# Patient Record
Sex: Male | Born: 1960 | Race: White | Hispanic: No | Marital: Married | Smoking: Current every day smoker
Health system: Southern US, Community
[De-identification: ages and names within clinical notes are randomized; demographics above are authoritative.]

## PROBLEM LIST (undated history)

## (undated) DIAGNOSIS — C801 Malignant (primary) neoplasm, unspecified: Secondary | ICD-10-CM

## (undated) HISTORY — DX: Malignant (primary) neoplasm, unspecified: C80.1

---

## 2012-01-08 LAB — HCV RNA QUANT: HCV RNA (IU/mL): 40700

## 2016-01-14 ENCOUNTER — Ambulatory Visit: Payer: Self-pay | Admitting: Osteopathic Medicine

## 2016-02-03 ENCOUNTER — Ambulatory Visit (INDEPENDENT_AMBULATORY_CARE_PROVIDER_SITE_OTHER): Payer: Self-pay | Admitting: Osteopathic Medicine

## 2016-02-03 ENCOUNTER — Encounter: Payer: Self-pay | Admitting: Osteopathic Medicine

## 2016-02-03 VITALS — BP 138/100 | HR 78 | Ht 71.0 in | Wt 168.0 lb

## 2016-02-03 DIAGNOSIS — K769 Liver disease, unspecified: Secondary | ICD-10-CM

## 2016-02-03 DIAGNOSIS — R1011 Right upper quadrant pain: Secondary | ICD-10-CM | POA: Insufficient documentation

## 2016-02-03 DIAGNOSIS — N400 Enlarged prostate without lower urinary tract symptoms: Secondary | ICD-10-CM

## 2016-02-03 DIAGNOSIS — K7689 Other specified diseases of liver: Secondary | ICD-10-CM

## 2016-02-03 DIAGNOSIS — B182 Chronic viral hepatitis C: Secondary | ICD-10-CM | POA: Insufficient documentation

## 2016-02-03 MED ORDER — OXYCODONE HCL 5 MG PO TABS: 5.0000 mg | ORAL_TABLET | Freq: Two times a day (BID) | ORAL | 0 refills | Status: DC | PRN

## 2016-02-03 MED ORDER — TAMSULOSIN HCL 0.4 MG PO CAPS: 0.4000 mg | ORAL_CAPSULE | Freq: Every day | ORAL | 6 refills | Status: DC

## 2016-02-03 NOTE — Patient Instructions (Signed)
1. Need MRI to get a better look at this spot on the liver. This spot may or may not be cancer. You may require further testing to diagnose whether or not this is cancer. 2. Need to get set up with gastroenterologist for help with management of hepatitis C and further evaluation for portal hypertension which may be contributing to your liver cirrhosis and your abdominal pain.  If you do not hear back about the MRI or the referral to GI within the next few days, please let us know. For blood test results, I typically will have these back the following day.

## 2016-02-03 NOTE — Progress Notes (Signed)
HPI: Joseph Blake is a 55 y.o. male  who presents to [place] today, 02/03/16,  for chief complaint of:  Chief Complaint  Patient presents with  . Establish Care     . Context: ER visit not long ago for abdominal pain, CT demonstrated 15 mm mass and patient was under the impression that this was cancer. Brings a printout from the ER discharge instructions, does mention possibility of cancer. Recommended follow-up with PCP for MRI. This is what brings the patient to the office here today. . Location: Right upper quadrant . Quality: Sore, occasionally sharp pain . Duration: Greater over the past month . Timing: Intermittent . Modifying factors: Oxycodone from emergency room was helpful, patient has been using this sparingly, . Assoc signs/symptoms: History of hepatitis C  Patient is accompanied by wife who assists with history-taking.   Past medical, surgical, social and family history reviewed: Past Medical History:  Diagnosis Date  . Cancer [facility])    LIVER   No past surgical history on file. Social History  Substance Use Topics  . Smoking status: Current Every Day Smoker    Types: Cigarettes  . Smokeless tobacco: Never Used  . Alcohol use No   Family History  Problem Relation Age of Onset  . Alcohol abuse Father   . Cancer Father     COLON AND LIVER  . Hyperlipidemia Maternal Uncle   . Alcohol abuse Paternal Grandfather      Current medication list and allergy/intolerance information reviewed:   Current Outpatient Prescriptions  Medication Sig Dispense Refill  . oxyCODONE (OXY IR/ROXICODONE) 5 MG immediate release tablet TK 1 T PO Q 6 H PRN FOR UP TO 8 DOSES  0  . tamsulosin (FLOMAX) 0.4 MG CAPS capsule Take 0.4 mg by mouth.     No current facility-administered medications for this visit.    No Known Allergies    Review of Systems:  Constitutional:  No  fever, no chills, No recent illness, No unintentional weight changes. No  significant fatigue.   HEENT: + occsional headache, no vision change, no hearing change, No sore throat, No  sinus pressure  Cardiac: No  chest pain, No  pressure, No palpitations, No  Orthopnea  Respiratory:  No  shortness of breath. No  Cough  Gastrointestinal: +abdominal pain, +nausea, No  vomiting,  No  blood in stool, No  diarrhea, No  constipation   Musculoskeletal: No new myalgia/arthralgia  Skin: No  Rash, No other wounds/concerning lesions  Hem/Onc: No  easy bruising/bleeding  Neurologic: No  weakness, No  dizziness,  Psychiatric: No  concerns with depression, No  concerns with anxiety,  Exam:  BP (!) 138/100   Pulse 78   Ht 5\' 11"  (1.803 m)   Wt 168 lb (76.2 kg)   BMI 23.43 kg/m   Constitutional: VS see above. General Appearance: alert, well-developed, well-nourished, NAD  Eyes: Normal lids and conjunctive, non-icteric sclera  Ears, Nose, Mouth, Throat: MMM, Normal external inspection ears/nares/mouth/lips/gums.   Neck: No masses, trachea midline. No thyroid enlargement. No tenderness/mass appreciated. No lymphadenopathy  Respiratory: Normal respiratory effort. no wheeze, no rhonchi, no rales  Cardiovascular: S1/S2 normal, no murmur, no rub/gallop auscultated. RRR. No lower extremity edema. .  Gastrointestinal: (+) tenderness RUQ, no masses. Mild hepatomegaly, no splenomegaly. No hernia appreciated. NO caput medusae, no fluid wave. Bowel sounds normal. Rectal exam deferred.   Musculoskeletal: Gait normal. No clubbing/cyanosis of digits.   Neurological:Normal balance/coordination. No tremor.   Skin:  warm, dry, intact. No rash/ulcer.   Psychiatric: Normal judgment/insight. Normal mood and affect. Oriented x3.    Reviewed abdominal CT results, seek care everywhere. 15 mm hepatic lesion concerning for neoplastic disease, possible portal hypertension, cirrhosis. AST and ALT elevated but not extremely so compared to previous measures.  ASSESSMENT/PLAN:    Advised patient that based on CT results alone I would not diagnose cancer, I believe there was probably a miscommunication with the emergency room. Of course, we do need to rule out neoplastic disease so we'll get MRI as noted below for further evaluation. Patient advised that based on results may need to consider further workup with biopsy/other.  Given hepatitis C, labs as below and patient needs to reestablish with gastroenterologist. Lack of Insurance has been an issue for him.  Requests Flomax, this was provided refill  Hepatic lesion - Plan: MR Liver W Wo Contrast, oxyCODONE (OXY IR/ROXICODONE) 5 MG immediate release tablet, Ambulatory referral to Gastroenterology  BPH (benign prostatic hyperplasia) - Plan: tamsulosin (FLOMAX) 0.4 MG CAPS capsule  Right upper quadrant pain - Plan: oxyCODONE (OXY IR/ROXICODONE) 5 MG immediate release tablet, CBC with Differential/Platelet, COMPLETE METABOLIC PANEL WITH GFR, Lipid panel  Chronic hepatitis C without hepatic coma (HCC) - Plan: HCV-RNA, Quant Real-Time PCR w/reflex, Ambulatory referral to Gastroenterology    Visit summary with medication list and pertinent instructions was printed for patient to review. All questions at time of visit were answered - patient instructed to contact office with any additional concerns. ER/RTC precautions were reviewed with the patient. Follow-up plan: Return if symptoms worsen or fail to improve, and depending on MRI results and labs.

## 2016-02-04 LAB — COMPLETE METABOLIC PANEL WITH GFR
ALBUMIN: 3.6 g/dL (ref 3.6–5.1)
ALK PHOS: 71 U/L (ref 40–115)
ALT: 80 U/L — ABNORMAL HIGH (ref 9–46)
AST: 118 U/L — ABNORMAL HIGH (ref 10–35)
BILIRUBIN TOTAL: 0.8 mg/dL (ref 0.2–1.2)
BUN: 16 mg/dL (ref 7–25)
CO2: 18 mmol/L — ABNORMAL LOW (ref 20–31)
Calcium: 8.9 mg/dL (ref 8.6–10.3)
Chloride: 111 mmol/L — ABNORMAL HIGH (ref 98–110)
Creat: 0.67 mg/dL — ABNORMAL LOW (ref 0.70–1.33)
GLUCOSE: 83 mg/dL (ref 65–99)
Potassium: 4 mmol/L (ref 3.5–5.3)
SODIUM: 139 mmol/L (ref 135–146)
TOTAL PROTEIN: 7 g/dL (ref 6.1–8.1)

## 2016-02-04 LAB — CBC WITH DIFFERENTIAL/PLATELET
BASOS ABS: 55 {cells}/uL (ref 0–200)
Basophils Relative: 1 %
EOS ABS: 165 {cells}/uL (ref 15–500)
Eosinophils Relative: 3 %
HEMATOCRIT: 35.3 % — AB (ref 38.5–50.0)
HEMOGLOBIN: 12.2 g/dL — AB (ref 13.2–17.1)
LYMPHS ABS: 1540 {cells}/uL (ref 850–3900)
Lymphocytes Relative: 28 %
MCH: 33.9 pg — AB (ref 27.0–33.0)
MCHC: 34.6 g/dL (ref 32.0–36.0)
MCV: 98.1 fL (ref 80.0–100.0)
MONO ABS: 330 {cells}/uL (ref 200–950)
Monocytes Relative: 6 %
NEUTROS ABS: 3410 {cells}/uL (ref 1500–7800)
Neutrophils Relative %: 62 %
Platelets: 131 10*3/uL — ABNORMAL LOW (ref 140–400)
RBC: 3.6 MIL/uL — ABNORMAL LOW (ref 4.20–5.80)
RDW: 16.3 % — ABNORMAL HIGH (ref 11.0–15.0)
WBC: 5.5 10*3/uL (ref 3.8–10.8)

## 2016-02-04 LAB — LIPID PANEL
Cholesterol: 130 mg/dL (ref 125–200)
HDL: 50 mg/dL (ref >=40)
LDL Cholesterol: 62 mg/dL (ref <130)
Total CHOL/HDL Ratio: 2.6 Ratio (ref <=5.0)
Triglycerides: 88 mg/dL (ref <150)
VLDL: 18 mg/dL (ref <30)

## 2016-02-08 LAB — HCV RNA,LIPA RFLX NS5A DRUG RESIST

## 2016-02-08 LAB — HCV RNA, QUANT REAL-TIME PCR W/REFLEX
HCV RNA, PCR, QN (Log): 4.98 LogIU/mL — ABNORMAL HIGH
HCV RNA, PCR, QN: 94800 [IU]/mL — AB

## 2016-02-10 ENCOUNTER — Ambulatory Visit (INDEPENDENT_AMBULATORY_CARE_PROVIDER_SITE_OTHER): Payer: Self-pay

## 2016-02-10 DIAGNOSIS — I8500 Esophageal varices without bleeding: Secondary | ICD-10-CM

## 2016-02-10 DIAGNOSIS — D732 Chronic congestive splenomegaly: Secondary | ICD-10-CM

## 2016-02-10 DIAGNOSIS — K769 Liver disease, unspecified: Secondary | ICD-10-CM

## 2016-02-10 MED ORDER — GADOBENATE DIMEGLUMINE 529 MG/ML IV SOLN
15.0000 mL | Freq: Once | INTRAVENOUS | Status: AC | PRN
  Administered 2016-02-10: 15 mL via INTRAVENOUS

## 2016-02-11 ENCOUNTER — Telehealth: Payer: Self-pay | Admitting: Family Medicine

## 2016-02-11 DIAGNOSIS — K7469 Other cirrhosis of liver: Secondary | ICD-10-CM

## 2016-02-11 DIAGNOSIS — C220 Liver cell carcinoma: Secondary | ICD-10-CM | POA: Insufficient documentation

## 2016-02-11 DIAGNOSIS — K7460 Unspecified cirrhosis of liver: Secondary | ICD-10-CM | POA: Insufficient documentation

## 2016-02-11 LAB — HCV RNA NS5A DRUG RESISTANCE

## 2016-02-11 NOTE — Telephone Encounter (Signed)
Liver MRI reviewed showing concern for hepatocellular carcinoma. This is likely diagnostic in the setting of cirrhosis. I discussed the findings with the patient over the phone this morning and will refer to [facility] health care as that is where he has been getting the majority of his liver care

## 2016-02-11 NOTE — Telephone Encounter (Signed)
-----   Message from Doree Albee, [place] sent at 02/10/2016  4:16 PM EDT -----   ----- Message ----- From: Interface, Rad Results In Sent: 02/10/2016   4:08 PM To: Emeterio Reeve, DO

## 2016-02-12 ENCOUNTER — Other Ambulatory Visit: Payer: Self-pay | Admitting: Family Medicine

## 2016-02-12 DIAGNOSIS — C220 Liver cell carcinoma: Secondary | ICD-10-CM

## 2016-02-12 DIAGNOSIS — K7469 Other cirrhosis of liver: Secondary | ICD-10-CM

## 2016-02-12 DIAGNOSIS — B182 Chronic viral hepatitis C: Secondary | ICD-10-CM

## 2016-02-12 NOTE — Progress Notes (Signed)
Liver biopsy at [facility] ordered.

## 2016-02-12 NOTE — Telephone Encounter (Signed)
Many thanks. 

## 2016-02-13 ENCOUNTER — Telehealth: Payer: Self-pay

## 2016-02-13 DIAGNOSIS — B1921 Unspecified viral hepatitis C with hepatic coma: Secondary | ICD-10-CM

## 2016-02-13 NOTE — Telephone Encounter (Signed)
Notes Recorded by Huel Cote, LPN on [id number] at [id number] PM EDT [facility] called, not enough frozen plasma sample collected to preform HCV RNA NS5A Drug Rest test. Will contact Pt and have him go to lab for additional blood draw. ------  Notes Recorded by Emeterio Reeve, DO on 02/08/2016 at 5:54 PM EDT Reviewed: nothing urgent was just waiting on HCV viral labs, he has known Dx Hep C

## 2016-02-20 LAB — HCV VIRAL RNA GEN3 NS5A DRUG RESIST: HCV NS5A SUBTYPE: NOT DETECTED

## 2016-03-06 ENCOUNTER — Telehealth: Payer: Self-pay | Admitting: Osteopathic Medicine

## 2016-03-06 DIAGNOSIS — K769 Liver disease, unspecified: Secondary | ICD-10-CM

## 2016-03-06 DIAGNOSIS — R1011 Right upper quadrant pain: Secondary | ICD-10-CM

## 2016-03-06 MED ORDER — OXYCODONE HCL 5 MG PO TABS: 5.0000 mg | ORAL_TABLET | Freq: Two times a day (BID) | ORAL | 0 refills | Status: DC | PRN

## 2016-03-06 NOTE — Telephone Encounter (Signed)
OK to refill

## 2016-03-06 NOTE — Telephone Encounter (Signed)
Pt and wife advised of Rx. Wife will pick up at her appt in our building this afternoon.

## 2016-03-20 ENCOUNTER — Other Ambulatory Visit: Payer: Self-pay

## 2016-03-20 DIAGNOSIS — N401 Enlarged prostate with lower urinary tract symptoms: Secondary | ICD-10-CM

## 2016-03-20 MED ORDER — TAMSULOSIN HCL 0.4 MG PO CAPS: 0.4000 mg | ORAL_CAPSULE | Freq: Every day | ORAL | 6 refills | Status: AC

## 2016-04-06 ENCOUNTER — Telehealth: Payer: Self-pay

## 2016-04-06 DIAGNOSIS — K769 Liver disease, unspecified: Secondary | ICD-10-CM

## 2016-04-06 DIAGNOSIS — R1011 Right upper quadrant pain: Secondary | ICD-10-CM

## 2016-04-07 ENCOUNTER — Telehealth: Payer: Self-pay

## 2016-04-07 MED ORDER — OXYCODONE HCL 5 MG PO TABS: 5.0000 mg | ORAL_TABLET | Freq: Two times a day (BID) | ORAL | 0 refills | Status: AC | PRN

## 2016-04-07 NOTE — Telephone Encounter (Signed)
OK to refill

## 2016-06-02 ENCOUNTER — Telehealth: Payer: Self-pay

## 2016-06-02 NOTE — Telephone Encounter (Signed)
Wife called and left a message asking for a refill on oxycodone. Please advise.

## 2016-06-02 NOTE — Telephone Encounter (Signed)
Reviewed [facility] controlled substance database. Patient has received multiple prescriptions for opiate pain medications from different providers, one I think one was after surgery but another more recent one from someone in [place] on 05/16/16 for thirty tablets - not sure if the patient was hospitalized or in the emergency room. Either way needs to follow-up  I understand that cost is a factor for him without insurance (unless that's changed), however he should come for a visit to discuss pain issues. I typically do not put patients on opiate pain medications long-term. Refill declined at this time until further discussion in the office.

## 2016-06-03 NOTE — Telephone Encounter (Signed)
Wife advised and patient scheduled.

## 2016-06-04 ENCOUNTER — Ambulatory Visit: Payer: Self-pay | Admitting: Osteopathic Medicine

## 2016-06-04 ENCOUNTER — Telehealth: Payer: Self-pay | Admitting: Osteopathic Medicine

## 2016-06-04 NOTE — Telephone Encounter (Signed)
Patient's wife called advised that he is on his way to the hospital by ambulance right now think he is bleeding internally again. She canceled his appointment for today and wanted to let you know why. Thanks

## 2016-06-04 NOTE — Telephone Encounter (Signed)
NOTED

## 2016-06-05 ENCOUNTER — Encounter: Payer: Self-pay | Admitting: Family Medicine

## 2016-06-05 ENCOUNTER — Other Ambulatory Visit: Payer: Self-pay

## 2016-06-05 ENCOUNTER — Ambulatory Visit (INDEPENDENT_AMBULATORY_CARE_PROVIDER_SITE_OTHER): Payer: Self-pay | Admitting: Family Medicine

## 2016-06-05 VITALS — BP 128/74 | HR 95 | Temp 97.4°F | Wt 178.0 lb

## 2016-06-05 DIAGNOSIS — N3000 Acute cystitis without hematuria: Secondary | ICD-10-CM

## 2016-06-05 DIAGNOSIS — M7989 Other specified soft tissue disorders: Secondary | ICD-10-CM

## 2016-06-05 DIAGNOSIS — R140 Abdominal distension (gaseous): Secondary | ICD-10-CM

## 2016-06-05 DIAGNOSIS — K7469 Other cirrhosis of liver: Secondary | ICD-10-CM

## 2016-06-05 MED ORDER — CIPROFLOXACIN HCL 500 MG PO TABS: 500.0000 mg | ORAL_TABLET | Freq: Two times a day (BID) | ORAL | 0 refills | Status: AC

## 2016-06-05 MED ORDER — CIPROFLOXACIN HCL 500 MG PO TABS: 500.0000 mg | ORAL_TABLET | Freq: Two times a day (BID) | ORAL | 0 refills | Status: DC

## 2016-06-05 NOTE — Progress Notes (Signed)
Joseph Blake is a 56 y.o. male who presents to [place] today for hospital follow-up. 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 This is complicated by esophageal varices status post banding. Additionally has a history of BPH. He's been seen multiple times by specialist recently. In early January he had radiofrequency ablation of the hepatocellular carcinoma. He was doing reasonably well until yesterday when he developed abdominal swelling.  This is associated with a bit of abdominal pain. He was seen in the emergency department where he had a laboratory assessment that did not show significantly elevated bilirubin or liver enzymes. His INR was slightly elevated at 1.2, the ammonia was slightly elevated at 61. The albumin is slightly decreased at 34. The urinalysis showed evidence of urinary tract infection. He was treated with IM ceftriaxone for presumed UTI. I'm not sure if the urine culture was obtained. No imaging was obtained. Today he is feeling about the same with mild abdominal distention and some mild pelvis pain. He notes mild leg swelling bilaterally that's been ongoing now for weeks. He denies fevers or chills vomiting or diarrhea. He denies significant pain or dysuria or burning with urination.  Past Medical History:  Diagnosis Date  . Cancer [facility])    LIVER   No past surgical history on file. Social History  Substance Use Topics  . Smoking status: Current Every Day Smoker    Types: Cigarettes  . Smokeless tobacco: Never Used  . Alcohol use No   family history includes Alcohol abuse in his father and paternal grandfather; Cancer in his father; Hyperlipidemia in his maternal uncle.  ROS as above:  Medications: Current Outpatient Prescriptions  Medication Sig Dispense Refill  .  oxyCODONE (OXY IR/ROXICODONE) 5 MG immediate release tablet Take 1 tablet (5 mg total) by mouth 2 (two) times daily as needed for severe pain (use sparingly for severe pain to avoid dependence/tolerance). 20 tablet 0  . tamsulosin (FLOMAX) 0.4 MG CAPS capsule Take 1 capsule (0.4 mg total) by mouth daily. 30 capsule 6  . ciprofloxacin (CIPRO) 500 MG tablet Take 1 tablet (500 mg total) by mouth 2 (two) times daily. 20 tablet 0   No current facility-administered medications for this visit.    No Known Allergies  Health Maintenance Health Maintenance  Topic Date Due  . HIV Screening  01/16/1978  . INFLUENZA VACCINE  12/10/2015  . TETANUS/TDAP  08/06/2020  . COLONOSCOPY  05/26/2021  . Hepatitis C Screening  Completed     Exam:  BP 128/74   Pulse 95   Temp 97.4 F (36.3 C) (Oral)   Wt 178 lb (80.7 kg)   BMI 24.83 kg/m  Gen: Well NAD Nontoxic appearing HEENT: EOMI,  MMM no scleral icterus Lungs: Normal work of breathing. CTABL Heart: RRR no MRG Abd: NABS, Soft. Mildly distended with mild positive fluid wave, minimally tender overlying the pelvis midline Exts: Brisk capillary refill, warm and well perfused. Trace edema bilateral lower extremities to shins.   No results found for this or any previous visit (from the past 72 hour(s)). No results found.    Assessment and Plan: 56 y.o. male with probable urinary tract infection complicating cirrhosis and probable ascites.  Plan to treat urinary tract infection with oral Cipro. Awaiting urine culture results if available.  Additionally patient has mild abdominal distention concerning for ascites. Plan to obtain a abdomen ultrasound in  the near future to evaluate for ascites.  Lower extremity swelling is very mild at this time. Recommend compression stockings.  I recommend patient follow-up with his primary care provider in the near future.   Orders Placed This Encounter  Procedures  . US Abdomen Complete    Standing Status:    Future    Standing Expiration Date:   08/03/2017    Order Specific Question:   Reason for Exam (SYMPTOM  OR DIAGNOSIS REQUIRED)    Answer:   eval ? ascities    Order Specific Question:   Preferred imaging location?    Answer:   [facility]    Discussed warning signs or symptoms. Please see discharge instructions. Patient expresses understanding.  I spent 40 minutes with this patient, greater than 50% was face-to-face time counseling regarding the above diagnosis.   LAB RESULTS FROM 06/04/16 at [facility]:   Urinalysis with Microscopic, clean catch (06/04/2016 3:53 PM) Urinalysis with Microscopic, clean catch (06/04/2016 3:53 PM)  Component Value Ref Range  Urine Color Orange (A)Comment: Biochemicals may be affected by the color of the urine.  Yellow   Urine Appearance Clear Clear  Urine Specific Gravity >1.030 (H) 1.005 - 1.030  Urine pH 6.5 5 to 7  Urine Protein - Dipstick Trace Negative mg/dl  Urine Glucose Negative Negative  Urine Ketones Trace Negative mg/dl  Urine Bilirubin Small (A) Comment:   Intensely colored urine samples may result in false positive reactions. Clinical evaluation of positive bilirubin results is recommended.  Negative  Urine Blood Negative Negative  Urine Nitrite Positive (A) Negative  Urine Urobilinogen 1.0 0.2 , 1.0 mg/dl  Urine Leukocyte Esterase Small (A) Negative  Urine WBC 0-2 0 - 2 /hpf  Urine Bacteria Trace (A) None /HPF  Urine Mucous Trace (A) None, Rare /LPF   Urinalysis with Microscopic, clean catch (06/04/2016 3:53 PM)  Specimen Performing Laboratory  Urine [facility]  [place], [place] [address]   Back to top of Lab Results   Ammonia (06/04/2016 3:47 PM) Ammonia (06/04/2016 3:47 PM)  Component Value Ref Range  Ammonia 61 27 - 102 mcg/dL   Ammonia (06/04/2016 3:47 PM)  Specimen Performing Laboratory  Blood [facility]  [place], [place] [address]    Back to top of Lab Results   PTT (06/04/2016 3:47 PM) PTT (06/04/2016 3:47 PM)  Component Value Ref Range  PTT 35 (H) 23 - 30 second(s)   PTT (06/04/2016 3:47 PM)  Specimen Performing Laboratory  Blood [facility]  [place], [place] [address]   Back to top of Lab Results   Protime-INR (06/04/2016 3:47 PM) Protime-INR (06/04/2016 3:47 PM)  Component Value Ref Range  PT 12.8 (H) 9.2 - 11.6 second(s)  INR 1.2 Comment:   INR Therapeutic Range for DVT, PE:2.0 - 3.0 INR Mechanical Prosthetic Heart Valves: 2.5 - 3.5 See Therapeutic ranges   Protime-INR (06/04/2016 3:47 PM)  Specimen Performing Laboratory  Blood [facility]  [place], [place] [address]   Back to top of Lab Results   Lipase (06/04/2016 3:47 PM) Lipase (06/04/2016 3:47 PM)  Component Value Ref Range  Lipase 43 0 - 59 IU/L   Lipase (06/04/2016 3:47 PM)  Specimen Performing Laboratory  Blood [facility]  [place],  [address]   Back to top of Lab Results   Comprehensive Metabolic Panel (66/10/3014  3:47 PM) Comprehensive Metabolic Panel (29/47/6546 3:47 PM)  Component Value Ref Range  Na 142 136 - 146 mmol/L  Potassium 4.2 3.7 - 5.4 mmol/L  Cl 109 (H) 97 - 108 mmol/L  CO2 21 20 - 32 mmol/L  Glucose 94 65 - 99 mg/dL  BUN 19 6 - 24 mg/dL  Creatinine 0.80 0.76 - 1.27 mg/dL  Ca 7.8 (L) 8.7 - 10.2 mg/dL  ALK PHOS 110 25 - 150 IU/L  T Bili 0.58 0.00 - 1.20 mg/dL  Total Protein 6.0 6.0 - 8.5 gm/dL  Alb 2.9 (L) 3.5 - 5.5 gm/dL  GLOBULIN 3.1 1.5 - 4.5 gm/dL  ALBUMIN/GLOBULIN RATIO 0.9 (L) 1.1 - 2.5  BUN/CREAT RATIO 23.8 11.0 - 26.0  ALT 33 0 - 55 IU/L  AST 74 (H) 0 - 40 IU/L  GFR AFRICAN AMERICAN 118 Comment:   African-American:  Normal GFR (glomerular filtration rate) > 60 mL/min/1.73 meters squared. < 60 may include impaired kidney function based on creatinine, age, gender, and  race normalized to accepted average body surface area  mL/min/1.46m  GFR Non African American 102 Comment:   Non African American:  Normal GFR (glomerular filtration rate) > 60 mL/min/1.73 meters squared. < 60 may include impaired kidney function based on creatinine, age, gender, and race normalized to accepted average body surface area.  mL/min/1.732m AGAP 12 7 - 16 mmol/L   Comprehensive Metabolic Panel (0150/35/4656:47 PM)  Specimen Performing Laboratory  [facility] [id number] Back to top of Lab Results   CBC And Differential (06/04/2016 3:47 PM) CBC And Differential (06/04/2016 3:47 PM)  Component Value Ref Range  WBC 3.9 (L) 5.1 - 10.8 thou/mcL  RBC 2.89 (L) 4.05 - 5.64 million/mcL  HGB 9.3 (L) 13.5 - 17.5 gm/dL  HCT 27.7 (L) 40.5 - 52.5 %  MCV 96 83 - 97 fL  MCH 32.2 28.0 - 33.0 pg  MCHC 33.6 32.0 - 36.0 gm/dL  Plt Ct 111 (L) 150 - 400 thou/mcL  RDW SD 66.9 (H) 36.0 - 47.0 fL  MPV 10.6 8.9 - 11.0 fL  NRBC% 0.0 /100WBC  NRBC 0.000 thou/mcL  NEUTROPHIL % 67.3 50.0 - 70.0 %  LYMPHOCYTE % 19.8 (L) 25.0 - 40.0 %  MONOCYTE % 5.9 4.0 - 12.0 %  Eosinophil % 6.2 (H) 1.0 - 6.0 %  BASOPHIL % 0.8 0.0 - 2.0 %  ABSOLUTE NEUTROPHIL COUNT 2.61 1.50 - 7.50 thou/mcL  ABSOLUTE LYMPHOCYTE COUNT 0.8 (L) 1.0 - 4.5 thou/mcL  MONO ABSOLUTE 0.2 0.1 - 0.8 thou/mcL  EOS ABSOLUTE 0.2 0.0 - 0.5 thou/mcL  BASO ABSOLUTE 0.0 0.0 - 0.2 thou/mcL   CBC And Differential (06/04/2016 3:47 PM)  Specimen Performing Laboratory  Blood [facility] [id number]

## 2016-06-05 NOTE — Patient Instructions (Signed)
Thank you for coming in today. Take cipro twice daily.  Use compression stocking.  Get the liver ultrasound soon.  Follow up with Dr Sheppard Coil soon.   If your belly pain worsens, or you have high fever, bad vomiting, blood in your stool or black tarry stool go to the Emergency Room.

## 2016-06-08 ENCOUNTER — Ambulatory Visit (INDEPENDENT_AMBULATORY_CARE_PROVIDER_SITE_OTHER): Payer: Medicaid Other

## 2016-06-08 ENCOUNTER — Other Ambulatory Visit: Payer: Self-pay

## 2016-06-08 ENCOUNTER — Telehealth: Payer: Self-pay | Admitting: Family Medicine

## 2016-06-08 DIAGNOSIS — R140 Abdominal distension (gaseous): Secondary | ICD-10-CM

## 2016-06-08 DIAGNOSIS — R188 Other ascites: Secondary | ICD-10-CM

## 2016-06-08 NOTE — Telephone Encounter (Signed)
Will switch to limited US abd to check for ascites per [place] recs.

## 2016-06-10 ENCOUNTER — Telehealth: Payer: Self-pay

## 2016-06-10 NOTE — Telephone Encounter (Signed)
I spoke to patient he stated that he did see his liver specialist and he talked like it was no big deal so patient is requesting that you call Dr. Janus Molder at [facility] and see if you can talk to him about it. Please advise on what to tell the patient. Rhonda Cunningham,CMA

## 2016-06-10 NOTE — Telephone Encounter (Signed)
If fluid is severe (as in - patients with severe fluid excess will look 9 months pregnant) it can be drained but I have not seen him so I can't comment on severity which is why I suggested asking liver specialist. If they think it's not worth draining, then I would trust their judgment. Typically, some fluid is not a problem. I haven't seen him about this in awhile, would recommend following up in the office to discuss further if he is able to do so.

## 2016-06-11 NOTE — Telephone Encounter (Signed)
Spoke to patient spouse and gave her advise as noted below. She stated that she will talk to him and when he wakes up and they will call the office to schedule. Shain Pauwels,CMA

## 2017-02-08 ENCOUNTER — Encounter: Payer: Self-pay | Admitting: Osteopathic Medicine

## 2017-02-08 DIAGNOSIS — F1210 Cannabis abuse, uncomplicated: Secondary | ICD-10-CM | POA: Insufficient documentation

## 2017-02-17 ENCOUNTER — Telehealth: Payer: Self-pay

## 2017-02-17 NOTE — Telephone Encounter (Signed)
Paramedics called and found patient dead this am at his house at 08:31:33.  They called to confirm that a death certificate could be signed.  Spoke with Dr. Georgina Snell who will sign it.

## 2017-02-17 NOTE — Telephone Encounter (Signed)
Noted. Sorry to hear about this. Thanks for handling the death certificate while I'm out!

## 2017-03-11 DEATH — deceased

## 2018-04-11 IMAGING — US US ABDOMEN LIMITED
1 series · 14 of 21 positions shown · non-contrast
Comparison: MRI of the abdomen of February 10, 2016

CLINICAL DATA: Abdominal distension. History of surgery for liver
mass excision in March 2016. History is cirrhosis and hepatic
malignancy. Suspect ascites.

EXAM:
LIMITED ABDOMEN ULTRASOUND FOR ASCITES
TECHNIQUE: Limited ultrasound survey for ascites was performed in all four
abdominal quadrants.

[Series 1: us abdomen limited · 0.24mm/px · 14 of 21 slices shown]
[im 1/21]
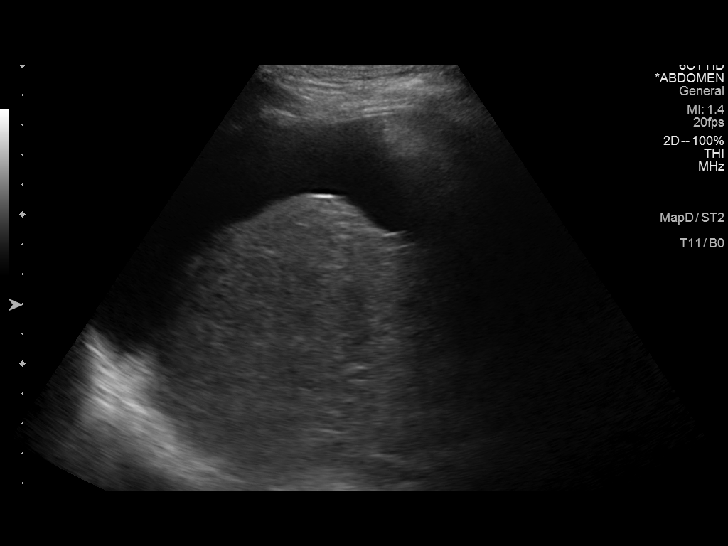
[im 3/21]
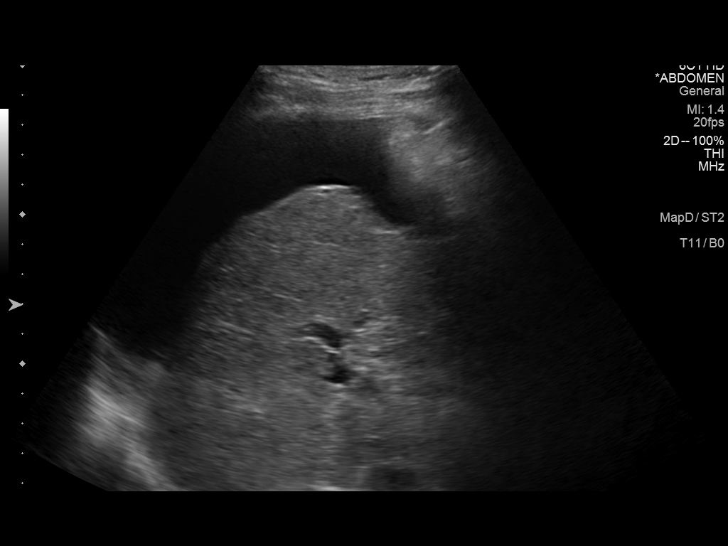
[im 4/21]
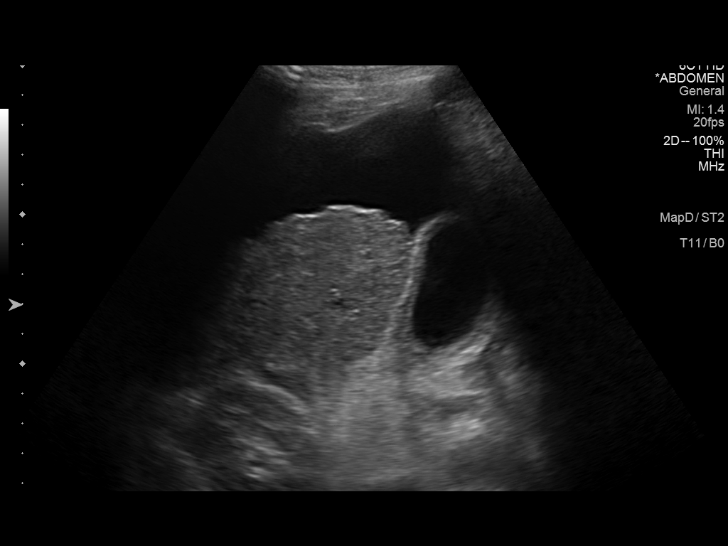
[im 6/21]
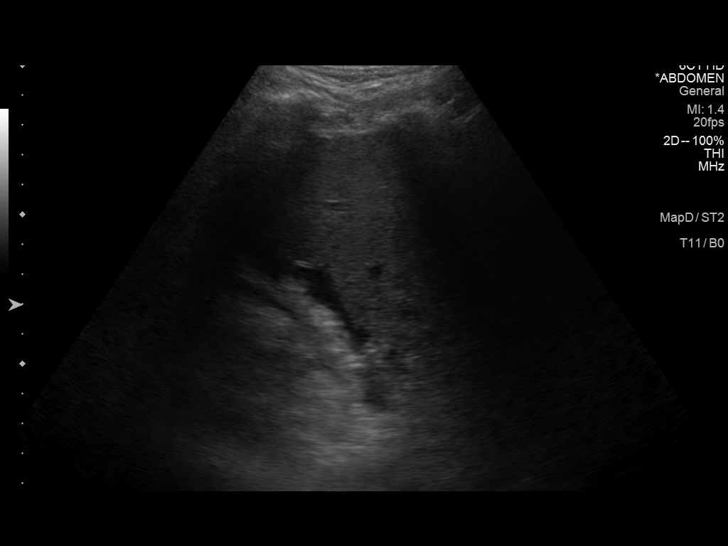
[im 7/21]
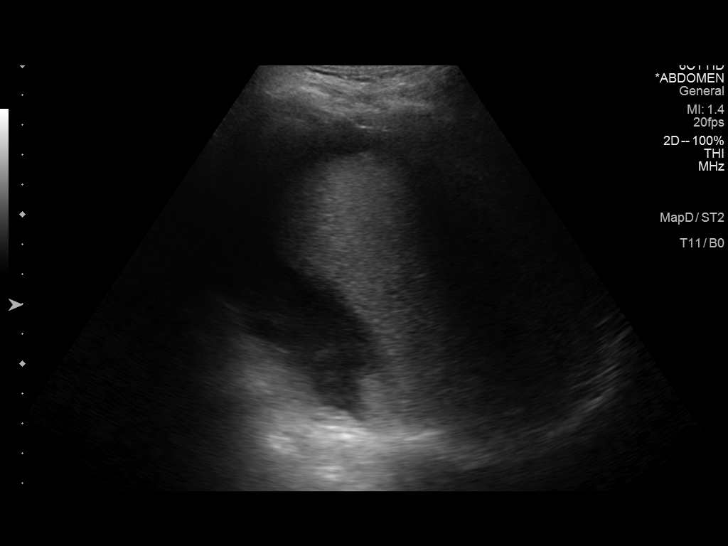
[im 9/21]
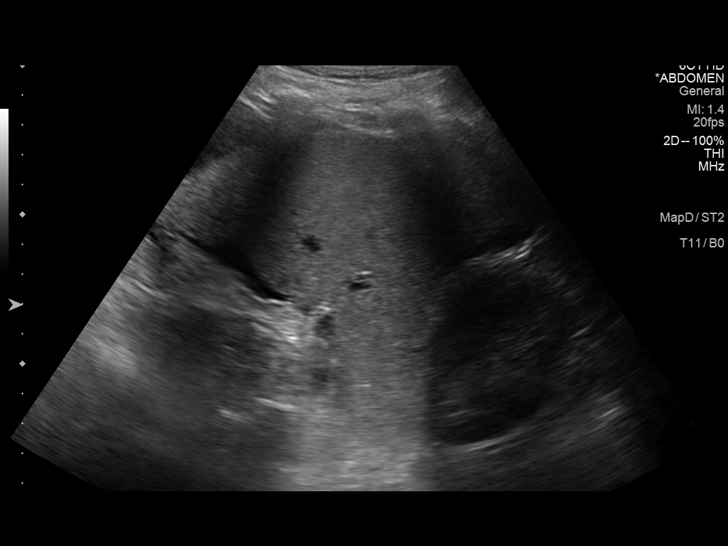
[im 10/21]
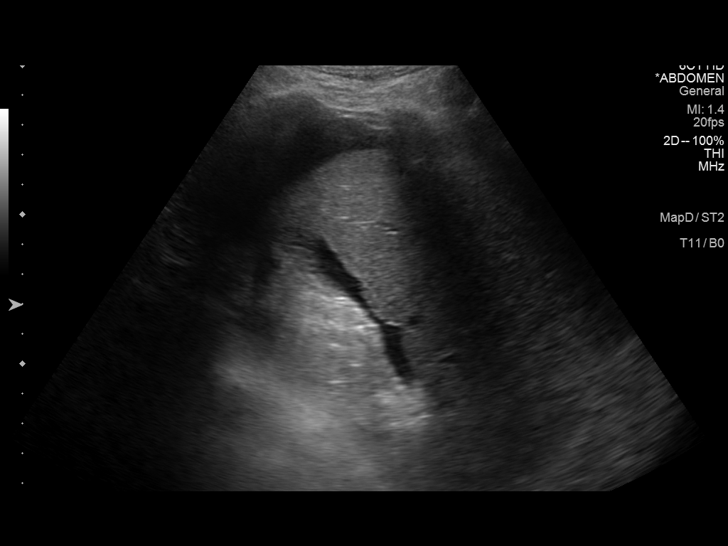
[im 12/21]
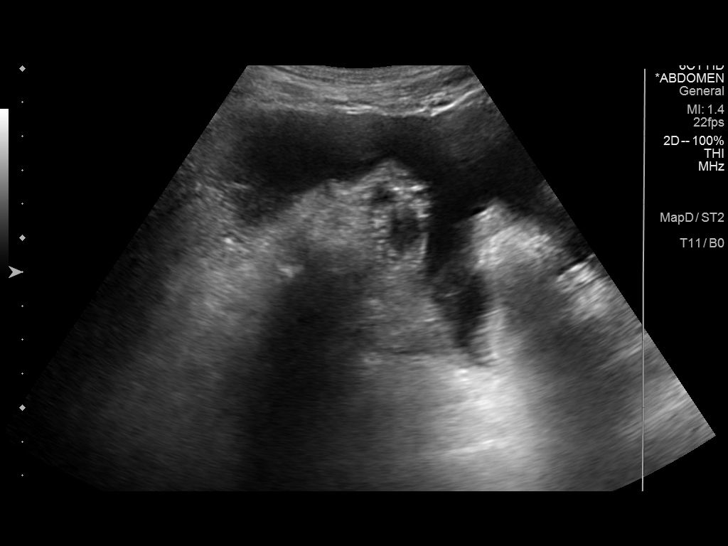
[im 13/21]
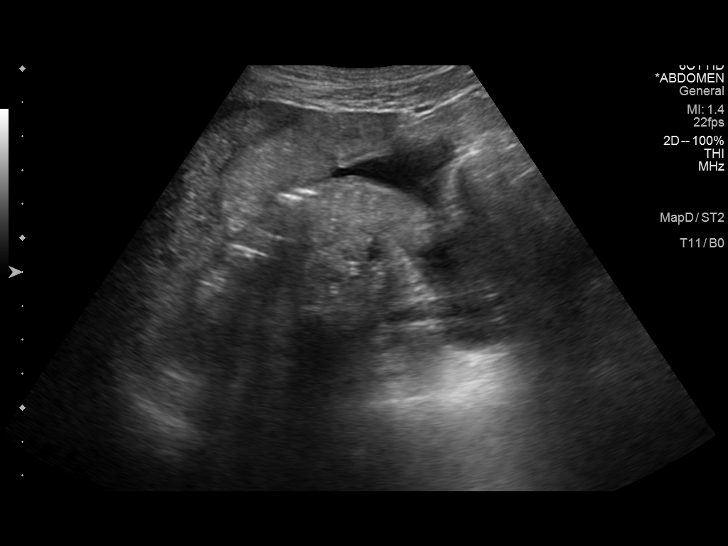
[im 15/21]
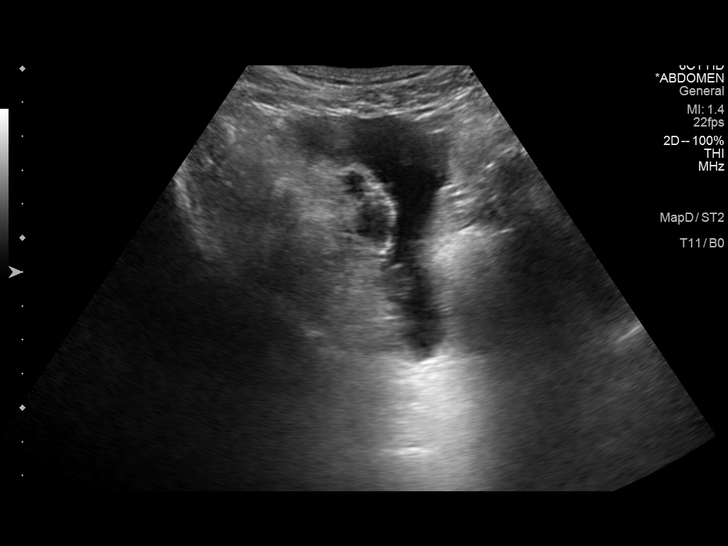
[im 16/21]
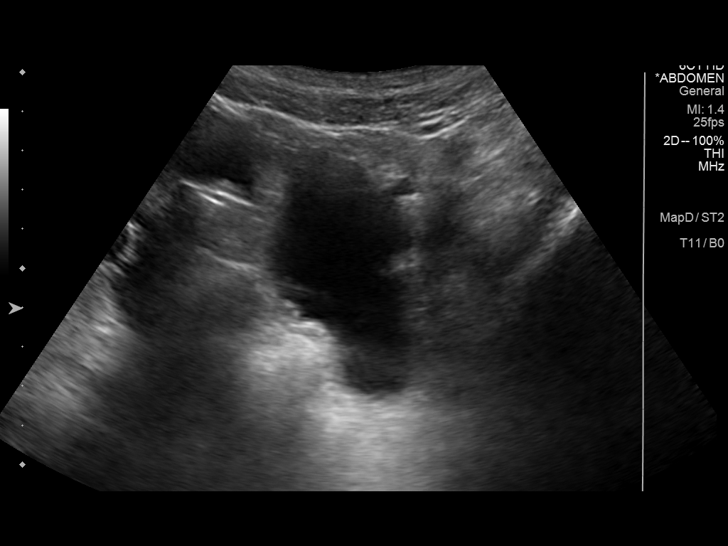
[im 18/21]
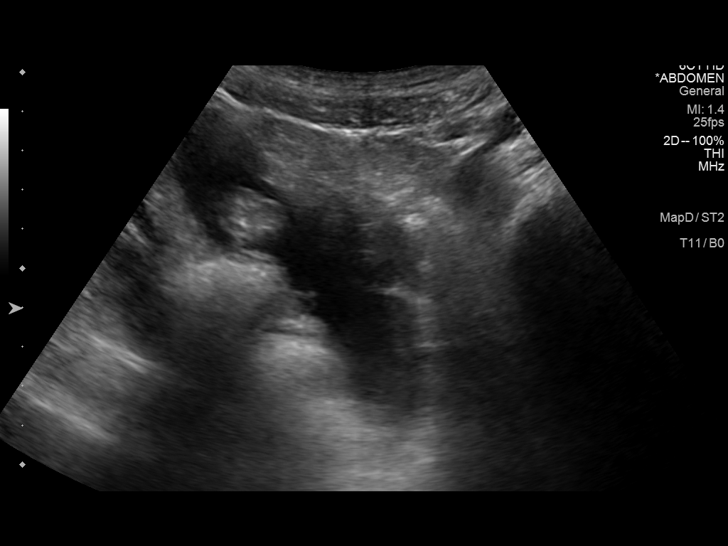
[im 19/21]
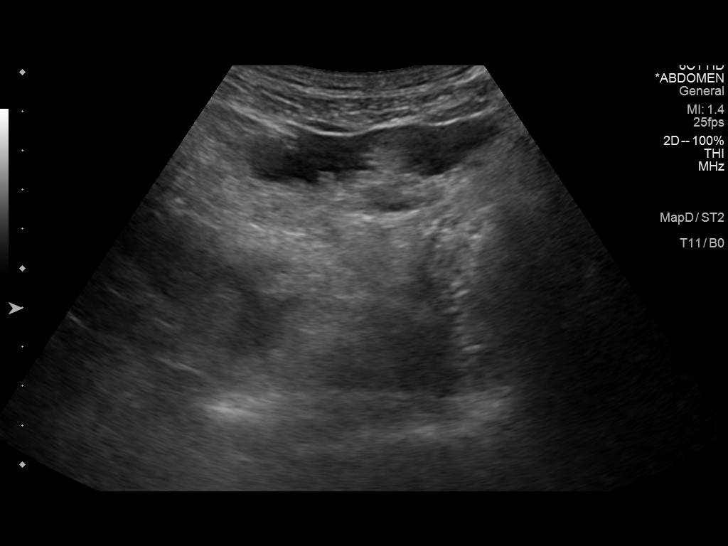
[im 21/21]
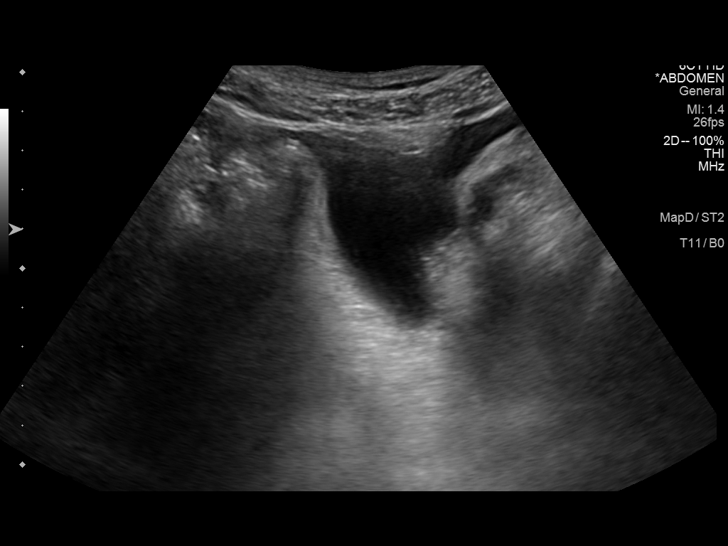

[14 of 21 positions shown; findings below may reference images not displayed]

FINDINGS: There is a moderate volume of ascites in all 4 quadrants. The liver
appears shrunken and its surface contour irregular.
IMPRESSION: Moderate volume of ascites demonstrated in all 4 quadrants.
Cirrhotic changes within the liver.
